# Patient Record
Sex: Female | Born: 1970 | Race: White | Hispanic: No | Marital: Married | Smoking: Former smoker
Health system: Southern US, Community
[De-identification: ages and names within clinical notes are randomized; demographics above are authoritative.]

## PROBLEM LIST (undated history)

## (undated) DIAGNOSIS — S0300XA Dislocation of jaw, unspecified side, initial encounter: Secondary | ICD-10-CM

## (undated) DIAGNOSIS — E039 Hypothyroidism, unspecified: Secondary | ICD-10-CM

## (undated) DIAGNOSIS — R011 Cardiac murmur, unspecified: Secondary | ICD-10-CM

## (undated) DIAGNOSIS — T4145XA Adverse effect of unspecified anesthetic, initial encounter: Secondary | ICD-10-CM

## (undated) DIAGNOSIS — Z87442 Personal history of urinary calculi: Secondary | ICD-10-CM

## (undated) DIAGNOSIS — F329 Major depressive disorder, single episode, unspecified: Secondary | ICD-10-CM

## (undated) DIAGNOSIS — I82812 Embolism and thrombosis of superficial veins of left lower extremities: Secondary | ICD-10-CM

## (undated) DIAGNOSIS — R51 Headache: Secondary | ICD-10-CM

## (undated) DIAGNOSIS — F32A Depression, unspecified: Secondary | ICD-10-CM

## (undated) DIAGNOSIS — D649 Anemia, unspecified: Secondary | ICD-10-CM

## (undated) DIAGNOSIS — J42 Unspecified chronic bronchitis: Secondary | ICD-10-CM

## (undated) DIAGNOSIS — R519 Headache, unspecified: Secondary | ICD-10-CM

## (undated) DIAGNOSIS — K219 Gastro-esophageal reflux disease without esophagitis: Secondary | ICD-10-CM

## (undated) DIAGNOSIS — T8859XA Other complications of anesthesia, initial encounter: Secondary | ICD-10-CM

## (undated) DIAGNOSIS — E0590 Thyrotoxicosis, unspecified without thyrotoxic crisis or storm: Secondary | ICD-10-CM

## (undated) HISTORY — PX: ABDOMINAL HYSTERECTOMY: SHX81

## (undated) HISTORY — PX: TUBAL LIGATION: SHX77

## (undated) HISTORY — PX: ENDOMETRIAL ABLATION: SHX621

---

## 2017-03-18 ENCOUNTER — Other Ambulatory Visit: Payer: Self-pay | Admitting: Orthopedic Surgery

## 2017-03-29 NOTE — Pre-Procedure Instructions (Addendum)
Lori PicaMelissa Reyes  03/29/2017      [facility] [facility] [place], [place] - [address] [place] [id number] Phone: [phone] Fax: [phone]    Your procedure is scheduled on Thursday, August 9th   [facility] Admitting at              (arrival time is per your surgeon's request- 9:15am)                                                                                                                                                         posted surgery time is 12:15p-2:15p)   Call this number if you have problems the morning of surgery:  [phone], for other questions call [phone] Mon--Fri from 8a-4p   Remember:   Do not eat food or drink liquids after midnight Wednesday.              4-5 days prior to surgery, STOP TAKING any Vitamins, Herbal Supplements, Anti-inflammatories.   Take these medicines the morning of surgery with A SIP OF WATER : Zyrtec, Klonopin, Levothyroxine, Omeprazole, Tramadol   Do not wear jewelry, make-up or nail polish.  Do not wear lotions, powders, perfumes, or deoderant.  Do not shave 48 hours prior to surgery.     Do not bring valuables to the hospital.  [facility] is not responsible for any belongings or valuables.  Contacts, dentures or bridgework may not be worn into surgery.  Leave your suitcase in the car.  After surgery it may be brought to your room. For patients admitted to the hospital, discharge time will be determined by your treatment team.  Please read over the following fact sheets that you were given. Pain Booklet, MRSA Information and Surgical Site Infection Prevention      [place]- Preparing For Surgery  Before surgery, you can play an important role. Because skin is not sterile, your skin needs to be as free of germs as possible. You can reduce the number of germs on your skin by washing with CHG (chlorahexidine gluconate) Soap before surgery.   CHG is an antiseptic cleaner which kills germs and bonds with the skin to continue killing germs even after washing.  Please do not use if you have an allergy to CHG or antibacterial soaps. If your skin becomes reddened/irritated stop using the CHG.  Do not shave (including legs and underarms) for at least 48 hours prior to first CHG shower. It is OK to shave your face.  Please follow these instructions carefully.   1. Shower the NIGHT BEFORE SURGERY and the MORNING OF SURGERY with CHG.   2. If you chose to wash your hair, wash your hair first as usual with your normal shampoo.  3. After you shampoo, rinse your hair and body thoroughly to remove the shampoo.  4. Use CHG as you would any other liquid soap. You can apply CHG directly to the skin and wash gently with a scrungie or a clean washcloth.   5. Apply the CHG Soap to your body ONLY FROM THE NECK DOWN.  Do not use on open wounds or open sores. Avoid contact with your eyes, ears, mouth and genitals (private parts). Wash genitals (private parts) with your normal soap.  6. Wash thoroughly, paying special attention to the area where your surgery will be performed.  7. Thoroughly rinse your body with warm water from the neck down.  8. DO NOT shower/wash with your normal soap after using and rinsing off the CHG Soap.  9. Pat yourself dry with a CLEAN TOWEL.   10. Wear CLEAN PAJAMAS   11. Place CLEAN SHEETS on your bed the night of your first shower and DO NOT SLEEP WITH PETS.    Day of Surgery: Do not apply any deodorants/lotions. Please wear clean clothes to the hospital/surgery center.

## 2017-04-01 ENCOUNTER — Encounter (HOSPITAL_COMMUNITY): Payer: Self-pay

## 2017-04-01 ENCOUNTER — Encounter (HOSPITAL_COMMUNITY)
Admission: RE | Admit: 2017-04-01 | Discharge: 2017-04-01 | Disposition: A | Discharge status: Home / Self Care | Payer: BLUE CROSS/BLUE SHIELD | Source: Ambulatory Visit | Attending: Orthopedic Surgery | Admitting: Orthopedic Surgery

## 2017-04-01 DIAGNOSIS — Z01818 Encounter for other preprocedural examination: Secondary | ICD-10-CM | POA: Diagnosis not present

## 2017-04-01 DIAGNOSIS — Z87891 Personal history of nicotine dependence: Secondary | ICD-10-CM | POA: Insufficient documentation

## 2017-04-01 DIAGNOSIS — Z79899 Other long term (current) drug therapy: Secondary | ICD-10-CM | POA: Diagnosis not present

## 2017-04-01 DIAGNOSIS — M79602 Pain in left arm: Secondary | ICD-10-CM | POA: Insufficient documentation

## 2017-04-01 DIAGNOSIS — Z87442 Personal history of urinary calculi: Secondary | ICD-10-CM | POA: Insufficient documentation

## 2017-04-01 DIAGNOSIS — Z86718 Personal history of other venous thrombosis and embolism: Secondary | ICD-10-CM | POA: Diagnosis not present

## 2017-04-01 DIAGNOSIS — R011 Cardiac murmur, unspecified: Secondary | ICD-10-CM | POA: Diagnosis not present

## 2017-04-01 DIAGNOSIS — K219 Gastro-esophageal reflux disease without esophagitis: Secondary | ICD-10-CM | POA: Insufficient documentation

## 2017-04-01 DIAGNOSIS — Z01812 Encounter for preprocedural laboratory examination: Secondary | ICD-10-CM | POA: Insufficient documentation

## 2017-04-01 DIAGNOSIS — E890 Postprocedural hypothyroidism: Secondary | ICD-10-CM | POA: Insufficient documentation

## 2017-04-01 HISTORY — DX: Dislocation of jaw, unspecified side, initial encounter: S03.00XA

## 2017-04-01 HISTORY — DX: Embolism and thrombosis of superficial veins of left lower extremity: I82.812

## 2017-04-01 HISTORY — DX: Personal history of urinary calculi: Z87.442

## 2017-04-01 HISTORY — DX: Other complications of anesthesia, initial encounter: T88.59XA

## 2017-04-01 HISTORY — DX: Gastro-esophageal reflux disease without esophagitis: K21.9

## 2017-04-01 HISTORY — DX: Hypothyroidism, unspecified: E03.9

## 2017-04-01 HISTORY — DX: Anemia, unspecified: D64.9

## 2017-04-01 HISTORY — DX: Depression, unspecified: F32.A

## 2017-04-01 HISTORY — DX: Major depressive disorder, single episode, unspecified: F32.9

## 2017-04-01 HISTORY — DX: Thyrotoxicosis, unspecified without thyrotoxic crisis or storm: E05.90

## 2017-04-01 HISTORY — DX: Adverse effect of unspecified anesthetic, initial encounter: T41.45XA

## 2017-04-01 HISTORY — DX: Cardiac murmur, unspecified: R01.1

## 2017-04-01 HISTORY — DX: Headache: R51

## 2017-04-01 HISTORY — DX: Unspecified chronic bronchitis: J42

## 2017-04-01 HISTORY — DX: Headache, unspecified: R51.9

## 2017-04-01 LAB — COMPREHENSIVE METABOLIC PANEL
ALBUMIN: 4.1 g/dL (ref 3.5–5.0)
ALT: 25 U/L (ref 14–54)
ANION GAP: 6 (ref 5–15)
AST: 25 U/L (ref 15–41)
Alkaline Phosphatase: 90 U/L (ref 38–126)
BUN: 9 mg/dL (ref 6–20)
CHLORIDE: 104 mmol/L (ref 101–111)
CO2: 29 mmol/L (ref 22–32)
Calcium: 9.6 mg/dL (ref 8.9–10.3)
Creatinine, Ser: 0.86 mg/dL (ref 0.44–1.00)
GFR calc Af Amer: >60 mL/min (ref >60)
GFR calc non Af Amer: >60 mL/min (ref >60)
GLUCOSE: 108 mg/dL — AB (ref 65–99)
POTASSIUM: 3.8 mmol/L (ref 3.5–5.1)
SODIUM: 139 mmol/L (ref 135–145)
Total Bilirubin: 0.5 mg/dL (ref 0.3–1.2)
Total Protein: 7 g/dL (ref 6.5–8.1)

## 2017-04-01 LAB — SURGICAL PCR SCREEN
MRSA, PCR: NEGATIVE
Staphylococcus aureus: NEGATIVE

## 2017-04-01 LAB — URINALYSIS, ROUTINE W REFLEX MICROSCOPIC
BILIRUBIN URINE: NEGATIVE
Bacteria, UA: NONE SEEN
Glucose, UA: NEGATIVE mg/dL
Ketones, ur: NEGATIVE mg/dL
NITRITE: NEGATIVE
PROTEIN: NEGATIVE mg/dL
SPECIFIC GRAVITY, URINE: 1.009 (ref 1.005–1.030)
pH: 5 (ref 5.0–8.0)

## 2017-04-01 LAB — CBC WITH DIFFERENTIAL/PLATELET
BASOS ABS: 0 10*3/uL (ref 0.0–0.1)
Basophils Relative: 0 %
EOS PCT: 2 %
Eosinophils Absolute: 0.3 10*3/uL (ref 0.0–0.7)
HEMATOCRIT: 39 % (ref 36.0–46.0)
Hemoglobin: 13 g/dL (ref 12.0–15.0)
LYMPHS ABS: 3.4 10*3/uL (ref 0.7–4.0)
Lymphocytes Relative: 29 %
MCH: 30.3 pg (ref 26.0–34.0)
MCHC: 33.3 g/dL (ref 30.0–36.0)
MCV: 90.9 fL (ref 78.0–100.0)
MONO ABS: 0.6 10*3/uL (ref 0.1–1.0)
MONOS PCT: 5 %
NEUTROS ABS: 7.6 10*3/uL (ref 1.7–7.7)
Neutrophils Relative %: 64 %
Platelets: 297 10*3/uL (ref 150–400)
RBC: 4.29 MIL/uL (ref 3.87–5.11)
RDW: 13.2 % (ref 11.5–15.5)
WBC: 11.9 10*3/uL — ABNORMAL HIGH (ref 4.0–10.5)

## 2017-04-01 LAB — APTT: aPTT: 34 seconds (ref 24–36)

## 2017-04-01 LAB — PROTIME-INR
INR: 0.95
Prothrombin Time: 12.7 seconds (ref 11.4–15.2)

## 2017-04-01 NOTE — Progress Notes (Addendum)
Anesthesia PAT Evaluation: Patient is a 46 year old female scheduled for C5-6 anterior disc arthroplasty on 04/04/17 by Dr. Phylliss Bob. 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 Prior to 2013, she was also known as Lori Reyes.   History includes former smoker (quit ~ 2017), murmur (only trace MR/TR by 2018 echo), chronic bronchitis, Graves' disease '03 s/p radioactive iodine, hypothyroidism, TMJ, GERD, anemia (since childhood), tubal ligation, hysterectomy, nephrolithiasis, LLE superficial venous thrombosis '10 (denied need for anticoagulants and no personal or family history of clotting disorder, although reported her grandmother was treated for "blood clots" with Plavix).   Anesthesia History: She reported that she was told by the surgeon Dr. Susann Givens and anesthesiologist that they "nearly lost her" after receiving succinylcholine for tubal ligation in [place], [place] at a free standing surgical center [facility] [phone]) around 2009. She thinks she required a reversal agent, but does not recall having to be reintubated. They wanted to keep her overnight, but she refused. She was told to get a medical alert bracelet, although she has not gotten one (discussed importance of having medical alert identification). She does not recall being told that she had suspected pseudocholinesterase deficiency and was not referred for testing. There is no family history of anesthesia complications. She has had subsequent procedures without succinylcholine including hysterectomy at [facility] in [place], [place] (aka [facility]; Dr. Valeta Harms) in 2016 and uterine ablation without incident. 2010 and 2016 anesthesia records requested and received. - 12/17/08 ([place], laparoscopic bilateral partial salpingectomy, Dr. Susann Givens. Anesthesiologist Dr. Alfonse Ras. CRNA Doneen Poisson). Prolonged neuromuscular blockade from  succinylcholine, lasted approximately one hour. MAC 3 used to place 7.0 oral ETT. - 2016 (looks like 03/03/15) anesthesia record ([facility]) intraoperative meds given include a fentanyl, lidocaine, propofol, rocuronium, dexamethasone, glycopyrrolate, ephedrine, meperidine, ketorolac, ondansetron, neostigmine, midazolam, hydromorphone. Direct laryngoscopy [place] 2 used to place 7.0 ETT.   Meds include Zyrtec, clonazepam, Lexapro, Flonase, levothyroxine, Cytomel, Robaxin, Prilosec, Ultram.  PCP is Dr. Raquel James with [facility] [phone]). Patient lives in [place], [place].   BP 127/64   Pulse 72   Temp (!) 36.2 C   Resp 20   Ht 5' 3.75" (1.619 m)   Wt 166 lb 14.4 oz (75.7 kg)   SpO2 98%   BMI 28.87 kg/m  Exam shows a pleasant Caucasian female in NAD. Heart RRR. I did not appreciate a murmur. Lungs clear. She denied ankle edema. No carotid bruits noted. Mallampati II.   She reported EKG and echocardiogram were done earlier this year for evaluation of swelling which has since resolved. She reported tests as "normal". She said she had a normal exercise treadmill test many years ago ( > 3 years) that was also normal. She denied chest pain, SOB, edema. As a child she did have a few syncopal episodes (ie, when her grandmother had blood drawn and once when her hair was being rolled), but denied any syncope since around age 55.   EKG and echocardiogram requested. (She believes either done at [facility] or [facility] [place] prior to 2013.) Update: - Echo 11/13/16 ([facility]): Conclusions: 1. Left ventricle: The left ventricular cavity size is normal. There is mild concentric hypertrophy noted. Systolic function is normal. Ejection fraction is 60%. Wall motion is normal. There are no regional wall motion abnormalities. 2. Unable to calculate PA pressure. 3. Trace mitral regurgitation. Trace tricuspid regurgitation. 4.  Normal  study.  Preoperative labs noted. Glucose 108. Cr 0.86. H/H 13.0/39.0. Trace leukocytes, negative nitrites on UA.  2009 and 2016 anesthesia records summarized above. Paper copies are in her chart for review by her anesthesia team as needed. Question if she may have pseudocholinesterase deficiency, although she has never been tested and is not aware of any family members that have any known anesthesia complications.   I will follow-up on EKG tracing from [place] regarding 2018 EKG. It would have been done at [facility] or possibly Dr. Evette Georges office. If one not received then we would have to get one on the day of surgery. (Update 04/02/17 3:10 PM: 10/31/16 EKG received from [facility] that showed NSR. If no acute changes then I anticipate that she can proceed as planned. Anesthesiologist to determine definitive anesthesia plan on the day of surgery.)  Lori Reyes [facility] Short Stay [facility]/Anesthesiology Phone [phone] 04/01/2017 5:29 PM

## 2017-04-01 NOTE — Progress Notes (Signed)
Patient states that back in 2009 during her BTL, she was told that "they almost lost her, and that I had reaction to succinylcholine".  They told her to have a bracelet made.  She doesn't remember all that happened. Surgery was done by a Dr. Diamantina Providenceacus, her ob/gyn.  [facility] was [facility], [place], [place].  Her other name at this time was Lori Reyes  She was also told she had a murmur.  Denies any fatigue, chest pain, sob.  Hasn't seen a cardio or has had any cardiac tests. PCP is Dr. Chanda Busingachel Kelly  LOV 03/2017 I did call [facility] [place] to get copy of her EKG

## 2017-04-02 NOTE — Anesthesia Preprocedure Evaluation (Addendum)
Anesthesia Evaluation  Patient identified by MRN, date of birth, ID band Patient awake    Reviewed: Allergy & Precautions, NPO status , Patient's Chart, lab work & pertinent test results  History of Anesthesia Complications (+) PSEUDOCHOLINESTERASE DEFICIENCY  Airway Mallampati: II  TM Distance: >3 FB Neck ROM: Full    Dental  (+) Dental Advisory Given   Pulmonary former smoker,    breath sounds clear to auscultation       Cardiovascular negative cardio ROS   Rhythm:Regular Rate:Normal     Neuro/Psych negative neurological ROS     GI/Hepatic Neg liver ROS, GERD  ,  Endo/Other  Hypothyroidism   Renal/GU negative Renal ROS     Musculoskeletal negative musculoskeletal ROS (+)   Abdominal   Peds  Hematology negative hematology ROS (+)   Anesthesia Other Findings   Reproductive/Obstetrics                            Lab Results  Component Value Date   WBC 11.9 (H) 04/01/2017   HGB 13.0 04/01/2017   HCT 39.0 04/01/2017   MCV 90.9 04/01/2017   PLT 297 04/01/2017   Lab Results  Component Value Date   CREATININE 0.86 04/01/2017   BUN 9 04/01/2017   NA 139 04/01/2017   K 3.8 04/01/2017   CL 104 04/01/2017   CO2 29 04/01/2017    Anesthesia Physical Anesthesia Plan  ASA: II  Anesthesia Plan: General   Post-op Pain Management:    Induction: Intravenous  PONV Risk Score and Plan: 3 and Ondansetron, Dexamethasone, Midazolam and Treatment may vary due to age or medical condition  Airway Management Planned: Oral ETT  Additional Equipment:   Intra-op Plan:   Post-operative Plan: Extubation in OR  Informed Consent: I have reviewed the patients History and Physical, chart, labs and discussed the procedure including the risks, benefits and alternatives for the proposed anesthesia with the patient or authorized representative who has indicated his/her understanding and acceptance.    Dental advisory given  Plan Discussed with: CRNA  Anesthesia Plan Comments:        Anesthesia Quick Evaluation

## 2017-04-04 ENCOUNTER — Encounter (HOSPITAL_COMMUNITY): Payer: Self-pay | Admitting: *Deleted

## 2017-04-04 ENCOUNTER — Encounter (HOSPITAL_COMMUNITY)
Admission: RE | Disposition: A | Discharge status: Home / Self Care | Payer: Self-pay | Source: Ambulatory Visit | Attending: Orthopedic Surgery

## 2017-04-04 ENCOUNTER — Observation Stay (HOSPITAL_COMMUNITY)
Admission: RE | Admit: 2017-04-04 | Discharge: 2017-04-05 | Disposition: A | Discharge status: Home / Self Care | Payer: BLUE CROSS/BLUE SHIELD | Source: Ambulatory Visit | Attending: Orthopedic Surgery | Admitting: Orthopedic Surgery

## 2017-04-04 ENCOUNTER — Ambulatory Visit (HOSPITAL_COMMUNITY): Payer: BLUE CROSS/BLUE SHIELD

## 2017-04-04 ENCOUNTER — Ambulatory Visit (HOSPITAL_COMMUNITY): Payer: BLUE CROSS/BLUE SHIELD | Admitting: Vascular Surgery

## 2017-04-04 ENCOUNTER — Ambulatory Visit (HOSPITAL_COMMUNITY): Payer: BLUE CROSS/BLUE SHIELD | Admitting: Certified Registered"

## 2017-04-04 DIAGNOSIS — Z86718 Personal history of other venous thrombosis and embolism: Secondary | ICD-10-CM | POA: Diagnosis not present

## 2017-04-04 DIAGNOSIS — R011 Cardiac murmur, unspecified: Secondary | ICD-10-CM | POA: Insufficient documentation

## 2017-04-04 DIAGNOSIS — Z91048 Other nonmedicinal substance allergy status: Secondary | ICD-10-CM | POA: Insufficient documentation

## 2017-04-04 DIAGNOSIS — E039 Hypothyroidism, unspecified: Secondary | ICD-10-CM | POA: Diagnosis not present

## 2017-04-04 DIAGNOSIS — K219 Gastro-esophageal reflux disease without esophagitis: Secondary | ICD-10-CM | POA: Insufficient documentation

## 2017-04-04 DIAGNOSIS — Z90710 Acquired absence of both cervix and uterus: Secondary | ICD-10-CM | POA: Insufficient documentation

## 2017-04-04 DIAGNOSIS — E0500 Thyrotoxicosis with diffuse goiter without thyrotoxic crisis or storm: Secondary | ICD-10-CM | POA: Insufficient documentation

## 2017-04-04 DIAGNOSIS — M79602 Pain in left arm: Secondary | ICD-10-CM | POA: Insufficient documentation

## 2017-04-04 DIAGNOSIS — Z87891 Personal history of nicotine dependence: Secondary | ICD-10-CM | POA: Insufficient documentation

## 2017-04-04 DIAGNOSIS — M50122 Cervical disc disorder at C5-C6 level with radiculopathy: Secondary | ICD-10-CM | POA: Diagnosis not present

## 2017-04-04 DIAGNOSIS — M26609 Unspecified temporomandibular joint disorder, unspecified side: Secondary | ICD-10-CM | POA: Insufficient documentation

## 2017-04-04 DIAGNOSIS — F329 Major depressive disorder, single episode, unspecified: Secondary | ICD-10-CM | POA: Insufficient documentation

## 2017-04-04 DIAGNOSIS — Z79899 Other long term (current) drug therapy: Secondary | ICD-10-CM | POA: Insufficient documentation

## 2017-04-04 DIAGNOSIS — D649 Anemia, unspecified: Secondary | ICD-10-CM | POA: Diagnosis not present

## 2017-04-04 DIAGNOSIS — Z419 Encounter for procedure for purposes other than remedying health state, unspecified: Secondary | ICD-10-CM

## 2017-04-04 DIAGNOSIS — Z888 Allergy status to other drugs, medicaments and biological substances status: Secondary | ICD-10-CM | POA: Diagnosis not present

## 2017-04-04 DIAGNOSIS — M48061 Spinal stenosis, lumbar region without neurogenic claudication: Secondary | ICD-10-CM | POA: Insufficient documentation

## 2017-04-04 DIAGNOSIS — M4802 Spinal stenosis, cervical region: Secondary | ICD-10-CM | POA: Diagnosis present

## 2017-04-04 DIAGNOSIS — Z87442 Personal history of urinary calculi: Secondary | ICD-10-CM | POA: Diagnosis not present

## 2017-04-04 DIAGNOSIS — M5410 Radiculopathy, site unspecified: Secondary | ICD-10-CM | POA: Diagnosis present

## 2017-04-04 DIAGNOSIS — M5412 Radiculopathy, cervical region: Secondary | ICD-10-CM

## 2017-04-04 HISTORY — PX: CERVICAL DISC ARTHROPLASTY: SHX587

## 2017-04-04 SURGERY — CERVICAL ANTERIOR DISC ARTHROPLASTY
Anesthesia: General | Site: Neck | Laterality: Left

## 2017-04-04 MED ORDER — ACETAMINOPHEN 325 MG PO TABS: 650.0000 mg | ORAL_TABLET | ORAL | Status: DC | PRN

## 2017-04-04 MED ORDER — MENTHOL 3 MG MT LOZG: 1.0000 | LOZENGE | OROMUCOSAL | Status: DC | PRN

## 2017-04-04 MED ORDER — HYDROMORPHONE HCL 1 MG/ML IJ SOLN
0.2500 mg | INTRAMUSCULAR | Status: DC | PRN
  Administered 2017-04-04 (×2): 0.5 mg via INTRAVENOUS

## 2017-04-04 MED ORDER — OXYCODONE-ACETAMINOPHEN 5-325 MG PO TABS
ORAL_TABLET | ORAL | Status: AC
  Filled 2017-04-04: qty 2

## 2017-04-04 MED ORDER — ACETAMINOPHEN 650 MG RE SUPP: 650.0000 mg | RECTAL | Status: DC | PRN

## 2017-04-04 MED ORDER — PANTOPRAZOLE SODIUM 40 MG PO TBEC
40.0000 mg | DELAYED_RELEASE_TABLET | Freq: Every day | ORAL | Status: DC
  Administered 2017-04-05: 40 mg via ORAL
  Filled 2017-04-04: qty 1

## 2017-04-04 MED ORDER — PROMETHAZINE HCL 25 MG/ML IJ SOLN: 6.2500 mg | INTRAMUSCULAR | Status: DC | PRN

## 2017-04-04 MED ORDER — DIAZEPAM 5 MG PO TABS
ORAL_TABLET | ORAL | Status: AC
  Filled 2017-04-04: qty 1

## 2017-04-04 MED ORDER — 0.9 % SODIUM CHLORIDE (POUR BTL) OPTIME
TOPICAL | Status: DC | PRN
  Administered 2017-04-04: 1000 mL

## 2017-04-04 MED ORDER — POVIDONE-IODINE 7.5 % EX SOLN: Freq: Once | CUTANEOUS | Status: DC

## 2017-04-04 MED ORDER — LIDOCAINE 2% (20 MG/ML) 5 ML SYRINGE
INTRAMUSCULAR | Status: AC
  Filled 2017-04-04: qty 5

## 2017-04-04 MED ORDER — ONDANSETRON HCL 4 MG/2ML IJ SOLN
INTRAMUSCULAR | Status: AC
  Filled 2017-04-04: qty 2

## 2017-04-04 MED ORDER — PHENYLEPHRINE 40 MCG/ML (10ML) SYRINGE FOR IV PUSH (FOR BLOOD PRESSURE SUPPORT)
PREFILLED_SYRINGE | INTRAVENOUS | Status: AC
  Filled 2017-04-04: qty 10

## 2017-04-04 MED ORDER — PROPOFOL 10 MG/ML IV BOLUS
INTRAVENOUS | Status: AC
  Filled 2017-04-04: qty 20

## 2017-04-04 MED ORDER — ROCURONIUM BROMIDE 100 MG/10ML IV SOLN
INTRAVENOUS | Status: DC | PRN
  Administered 2017-04-04: 20 mg via INTRAVENOUS
  Administered 2017-04-04: 50 mg via INTRAVENOUS
  Administered 2017-04-04: 10 mg via INTRAVENOUS

## 2017-04-04 MED ORDER — SUGAMMADEX SODIUM 200 MG/2ML IV SOLN
INTRAVENOUS | Status: DC | PRN
  Administered 2017-04-04: 150.6 mg via INTRAVENOUS

## 2017-04-04 MED ORDER — DOCUSATE SODIUM 100 MG PO CAPS
100.0000 mg | ORAL_CAPSULE | Freq: Two times a day (BID) | ORAL | Status: DC
  Administered 2017-04-04 – 2017-04-05 (×2): 100 mg via ORAL
  Filled 2017-04-04 (×2): qty 1

## 2017-04-04 MED ORDER — CLONAZEPAM 0.5 MG PO TABS
2.0000 mg | ORAL_TABLET | Freq: Two times a day (BID) | ORAL | Status: DC
  Administered 2017-04-04 – 2017-04-05 (×2): 2 mg via ORAL
  Filled 2017-04-04 (×2): qty 4

## 2017-04-04 MED ORDER — ONDANSETRON HCL 4 MG PO TABS
4.0000 mg | ORAL_TABLET | Freq: Four times a day (QID) | ORAL | Status: DC | PRN
  Administered 2017-04-04: 4 mg via ORAL
  Filled 2017-04-04: qty 1

## 2017-04-04 MED ORDER — DIAZEPAM 5 MG PO TABS
5.0000 mg | ORAL_TABLET | Freq: Four times a day (QID) | ORAL | Status: DC | PRN
  Administered 2017-04-04 – 2017-04-05 (×2): 5 mg via ORAL
  Filled 2017-04-04: qty 1

## 2017-04-04 MED ORDER — SCOPOLAMINE 1 MG/3DAYS TD PT72
MEDICATED_PATCH | TRANSDERMAL | Status: AC
  Filled 2017-04-04: qty 1

## 2017-04-04 MED ORDER — ADULT MULTIVITAMIN W/MINERALS CH
1.0000 | ORAL_TABLET | Freq: Every day | ORAL | Status: DC
  Administered 2017-04-04 – 2017-04-05 (×2): 1 via ORAL
  Filled 2017-04-04 (×2): qty 1

## 2017-04-04 MED ORDER — CEFAZOLIN SODIUM-DEXTROSE 2-4 GM/100ML-% IV SOLN
2.0000 g | Freq: Three times a day (TID) | INTRAVENOUS | Status: AC
  Administered 2017-04-04 – 2017-04-05 (×2): 2 g via INTRAVENOUS
  Filled 2017-04-04 (×2): qty 100

## 2017-04-04 MED ORDER — ALUM & MAG HYDROXIDE-SIMETH 200-200-20 MG/5ML PO SUSP: 30.0000 mL | Freq: Four times a day (QID) | ORAL | Status: DC | PRN

## 2017-04-04 MED ORDER — CEFAZOLIN SODIUM-DEXTROSE 2-4 GM/100ML-% IV SOLN
2.0000 g | INTRAVENOUS | Status: AC
  Administered 2017-04-04: 2 g via INTRAVENOUS
  Filled 2017-04-04: qty 100

## 2017-04-04 MED ORDER — THROMBIN 20000 UNITS EX SOLR
CUTANEOUS | Status: DC | PRN
  Administered 2017-04-04: 12:00:00 via TOPICAL

## 2017-04-04 MED ORDER — FENTANYL CITRATE (PF) 250 MCG/5ML IJ SOLN
INTRAMUSCULAR | Status: DC | PRN
  Administered 2017-04-04: 100 ug via INTRAVENOUS

## 2017-04-04 MED ORDER — ZOLPIDEM TARTRATE 5 MG PO TABS: 5.0000 mg | ORAL_TABLET | Freq: Every evening | ORAL | Status: DC | PRN

## 2017-04-04 MED ORDER — GLYCOPYRROLATE 0.2 MG/ML IJ SOLN
INTRAMUSCULAR | Status: DC | PRN
  Administered 2017-04-04: 0.2 mg via INTRAVENOUS

## 2017-04-04 MED ORDER — SODIUM CHLORIDE 0.9% FLUSH: 3.0000 mL | INTRAVENOUS | Status: DC | PRN

## 2017-04-04 MED ORDER — MORPHINE SULFATE (PF) 4 MG/ML IV SOLN
1.0000 mg | INTRAVENOUS | Status: DC | PRN
  Administered 2017-04-04: 2 mg via INTRAVENOUS
  Filled 2017-04-04: qty 1

## 2017-04-04 MED ORDER — BUPIVACAINE-EPINEPHRINE 0.25% -1:200000 IJ SOLN
INTRAMUSCULAR | Status: DC | PRN
  Administered 2017-04-04: 30 mL

## 2017-04-04 MED ORDER — HYDROMORPHONE HCL 1 MG/ML IJ SOLN
INTRAMUSCULAR | Status: AC
  Administered 2017-04-04: 0.5 mg via INTRAVENOUS
  Filled 2017-04-04: qty 1

## 2017-04-04 MED ORDER — FLEET ENEMA 7-19 GM/118ML RE ENEM: 1.0000 | ENEMA | Freq: Once | RECTAL | Status: DC | PRN

## 2017-04-04 MED ORDER — LEVOTHYROXINE SODIUM 88 MCG PO TABS
88.0000 ug | ORAL_TABLET | Freq: Every day | ORAL | Status: DC
  Administered 2017-04-05: 88 ug via ORAL
  Filled 2017-04-04: qty 1

## 2017-04-04 MED ORDER — SENNOSIDES-DOCUSATE SODIUM 8.6-50 MG PO TABS
1.0000 | ORAL_TABLET | Freq: Every evening | ORAL | Status: DC | PRN
Start: 2017-04-04 — End: 2017-04-05

## 2017-04-04 MED ORDER — PANTOPRAZOLE SODIUM 40 MG IV SOLR: 40.0000 mg | Freq: Every day | INTRAVENOUS | Status: DC

## 2017-04-04 MED ORDER — SODIUM CHLORIDE 0.9% FLUSH
3.0000 mL | Freq: Two times a day (BID) | INTRAVENOUS | Status: DC
  Administered 2017-04-04: 3 mL via INTRAVENOUS

## 2017-04-04 MED ORDER — SODIUM CHLORIDE 0.9 % IV SOLN: 250.0000 mL | INTRAVENOUS | Status: DC

## 2017-04-04 MED ORDER — FENTANYL CITRATE (PF) 250 MCG/5ML IJ SOLN
INTRAMUSCULAR | Status: AC
  Filled 2017-04-04: qty 5

## 2017-04-04 MED ORDER — FLUTICASONE PROPIONATE 50 MCG/ACT NA SUSP
1.0000 | Freq: Every day | NASAL | Status: DC
  Administered 2017-04-04: 1 via NASAL
  Filled 2017-04-04: qty 16

## 2017-04-04 MED ORDER — LIOTHYRONINE SODIUM 5 MCG PO TABS
5.0000 ug | ORAL_TABLET | Freq: Two times a day (BID) | ORAL | Status: DC
  Administered 2017-04-04 – 2017-04-05 (×2): 5 ug via ORAL
  Filled 2017-04-04 (×2): qty 1

## 2017-04-04 MED ORDER — ONDANSETRON HCL 4 MG/2ML IJ SOLN: 4.0000 mg | Freq: Four times a day (QID) | INTRAMUSCULAR | Status: DC | PRN

## 2017-04-04 MED ORDER — ROCURONIUM BROMIDE 10 MG/ML (PF) SYRINGE
PREFILLED_SYRINGE | INTRAVENOUS | Status: AC
  Filled 2017-04-04: qty 5

## 2017-04-04 MED ORDER — PHENOL 1.4 % MT LIQD
1.0000 | OROMUCOSAL | Status: DC | PRN
  Administered 2017-04-05: 1 via OROMUCOSAL
  Filled 2017-04-04: qty 177

## 2017-04-04 MED ORDER — THROMBIN 20000 UNITS EX SOLR
CUTANEOUS | Status: AC
  Filled 2017-04-04: qty 20000

## 2017-04-04 MED ORDER — SUGAMMADEX SODIUM 200 MG/2ML IV SOLN
INTRAVENOUS | Status: AC
  Filled 2017-04-04: qty 2

## 2017-04-04 MED ORDER — MIDAZOLAM HCL 2 MG/2ML IJ SOLN
INTRAMUSCULAR | Status: AC
  Filled 2017-04-04: qty 2

## 2017-04-04 MED ORDER — BISACODYL 5 MG PO TBEC: 5.0000 mg | DELAYED_RELEASE_TABLET | Freq: Every day | ORAL | Status: DC | PRN

## 2017-04-04 MED ORDER — LORATADINE 10 MG PO TABS
10.0000 mg | ORAL_TABLET | Freq: Every day | ORAL | Status: DC
  Administered 2017-04-05: 10 mg via ORAL
  Filled 2017-04-04: qty 1

## 2017-04-04 MED ORDER — ONDANSETRON HCL 4 MG/2ML IJ SOLN
INTRAMUSCULAR | Status: DC | PRN
  Administered 2017-04-04: 4 mg via INTRAVENOUS

## 2017-04-04 MED ORDER — LACTATED RINGERS IV SOLN
INTRAVENOUS | Status: DC
  Administered 2017-04-04 (×2): via INTRAVENOUS

## 2017-04-04 MED ORDER — MIDAZOLAM HCL 5 MG/5ML IJ SOLN
INTRAMUSCULAR | Status: DC | PRN
  Administered 2017-04-04: 2 mg via INTRAVENOUS

## 2017-04-04 MED ORDER — PHENYLEPHRINE 40 MCG/ML (10ML) SYRINGE FOR IV PUSH (FOR BLOOD PRESSURE SUPPORT)
PREFILLED_SYRINGE | INTRAVENOUS | Status: DC | PRN
  Administered 2017-04-04: 80 ug via INTRAVENOUS

## 2017-04-04 MED ORDER — PROPOFOL 10 MG/ML IV BOLUS
INTRAVENOUS | Status: DC | PRN
  Administered 2017-04-04: 160 mg via INTRAVENOUS

## 2017-04-04 MED ORDER — MORPHINE SULFATE (PF) 4 MG/ML IV SOLN: 1.0000 mg | INTRAVENOUS | Status: DC | PRN

## 2017-04-04 MED ORDER — EPHEDRINE 5 MG/ML INJ
INTRAVENOUS | Status: AC
  Filled 2017-04-04: qty 10

## 2017-04-04 MED ORDER — LIDOCAINE 2% (20 MG/ML) 5 ML SYRINGE
INTRAMUSCULAR | Status: DC | PRN
  Administered 2017-04-04 (×2): 60 mg via INTRAVENOUS

## 2017-04-04 MED ORDER — EPHEDRINE SULFATE-NACL 50-0.9 MG/10ML-% IV SOSY
PREFILLED_SYRINGE | INTRAVENOUS | Status: DC | PRN
  Administered 2017-04-04: 5 mg via INTRAVENOUS
  Administered 2017-04-04: 10 mg via INTRAVENOUS
  Administered 2017-04-04: 5 mg via INTRAVENOUS

## 2017-04-04 MED ORDER — BUPIVACAINE-EPINEPHRINE (PF) 0.25% -1:200000 IJ SOLN
INTRAMUSCULAR | Status: AC
  Filled 2017-04-04: qty 30

## 2017-04-04 MED ORDER — ESCITALOPRAM OXALATE 20 MG PO TABS
20.0000 mg | ORAL_TABLET | Freq: Every evening | ORAL | Status: DC
  Administered 2017-04-04: 20 mg via ORAL
  Filled 2017-04-04 (×2): qty 1

## 2017-04-04 MED ORDER — OXYCODONE-ACETAMINOPHEN 5-325 MG PO TABS
1.0000 | ORAL_TABLET | ORAL | Status: DC | PRN
  Administered 2017-04-04 – 2017-04-05 (×5): 2 via ORAL
  Filled 2017-04-04 (×4): qty 2

## 2017-04-04 MED ORDER — BUPIVACAINE LIPOSOME 1.3 % IJ SUSP
20.0000 mL | INTRAMUSCULAR | Status: DC
  Filled 2017-04-04: qty 20

## 2017-04-04 SURGICAL SUPPLY — 70 items
BENZOIN TINCTURE PRP APPL 2/3 (GAUZE/BANDAGES/DRESSINGS) ×3 IMPLANT
BIT DRILL NEURO 2X3.1 SFT TUCH (MISCELLANEOUS) ×1 IMPLANT
BIT MILLING PRODISC 2.0 STER (BIT) ×3 IMPLANT
BLADE CLIPPER SURG (BLADE) ×3 IMPLANT
BLADE SURG 15 STRL LF DISP TIS (BLADE) ×1 IMPLANT
BLADE SURG 15 STRL SS (BLADE) ×2
BUR MATCHSTICK NEURO 3.0 LAGG (BURR) IMPLANT
CARTRIDGE OIL MAESTRO DRILL (MISCELLANEOUS) ×1 IMPLANT
CLOSURE STERI-STRIP 1/2X4 (GAUZE/BANDAGES/DRESSINGS) ×1
CLOSURE WOUND 1/2 X4 (GAUZE/BANDAGES/DRESSINGS) ×1
CLSR STERI-STRIP ANTIMIC 1/2X4 (GAUZE/BANDAGES/DRESSINGS) ×2 IMPLANT
CORDS BIPOLAR (ELECTRODE) ×3 IMPLANT
COVER SURGICAL LIGHT HANDLE (MISCELLANEOUS) ×3 IMPLANT
CRADLE DONUT ADULT HEAD (MISCELLANEOUS) ×3 IMPLANT
DIFFUSER DRILL AIR PNEUMATIC (MISCELLANEOUS) ×3 IMPLANT
DISC PRODISC-C MED 5MM (Neuro Prosthesis/Implant) ×3 IMPLANT
DRAIN JACKSON RD 7FR 3/32 (WOUND CARE) IMPLANT
DRAPE C-ARM 42X72 X-RAY (DRAPES) ×3 IMPLANT
DRAPE POUCH INSTRU U-SHP 10X18 (DRAPES) ×3 IMPLANT
DRAPE SURG 17X23 STRL (DRAPES) ×9 IMPLANT
DRILL NEURO 2X3.1 SOFT TOUCH (MISCELLANEOUS) ×3
DURAPREP 26ML APPLICATOR (WOUND CARE) ×3 IMPLANT
ELECT COATED BLADE 2.86 ST (ELECTRODE) ×3 IMPLANT
ELECT REM PT RETURN 9FT ADLT (ELECTROSURGICAL) ×3
ELECTRODE REM PT RTRN 9FT ADLT (ELECTROSURGICAL) ×1 IMPLANT
EVACUATOR SILICONE 100CC (DRAIN) IMPLANT
GAUZE SPONGE 4X4 12PLY STRL (GAUZE/BANDAGES/DRESSINGS) ×3 IMPLANT
GAUZE SPONGE 4X4 16PLY XRAY LF (GAUZE/BANDAGES/DRESSINGS) ×3 IMPLANT
GLOVE BIO SURGEON STRL SZ7 (GLOVE) ×3 IMPLANT
GLOVE BIO SURGEON STRL SZ8 (GLOVE) ×3 IMPLANT
GLOVE BIOGEL PI IND STRL 7.0 (GLOVE) ×2 IMPLANT
GLOVE BIOGEL PI IND STRL 8 (GLOVE) ×1 IMPLANT
GLOVE BIOGEL PI INDICATOR 7.0 (GLOVE) ×4
GLOVE BIOGEL PI INDICATOR 8 (GLOVE) ×2
GOWN STRL REUS W/ TWL LRG LVL3 (GOWN DISPOSABLE) ×1 IMPLANT
GOWN STRL REUS W/ TWL XL LVL3 (GOWN DISPOSABLE) ×1 IMPLANT
GOWN STRL REUS W/TWL LRG LVL3 (GOWN DISPOSABLE) ×2
GOWN STRL REUS W/TWL XL LVL3 (GOWN DISPOSABLE) ×2
IV CATH 14GX2 1/4 (CATHETERS) ×3 IMPLANT
KIT BASIN OR (CUSTOM PROCEDURE TRAY) ×3 IMPLANT
KIT ROOM TURNOVER OR (KITS) ×3 IMPLANT
MANIFOLD NEPTUNE II (INSTRUMENTS) ×3 IMPLANT
NEEDLE PRECISIONGLIDE 27X1.5 (NEEDLE) ×3 IMPLANT
NEEDLE SPNL 20GX3.5 QUINCKE YW (NEEDLE) ×3 IMPLANT
NS IRRIG 1000ML POUR BTL (IV SOLUTION) ×3 IMPLANT
NUT PRODISC C RETAINER (Nut) ×6 IMPLANT
OIL CARTRIDGE MAESTRO DRILL (MISCELLANEOUS) ×3
PACK ORTHO CERVICAL (CUSTOM PROCEDURE TRAY) ×3 IMPLANT
PAD ARMBOARD 7.5X6 YLW CONV (MISCELLANEOUS) ×6 IMPLANT
PATTIES SURGICAL .5 X.5 (GAUZE/BANDAGES/DRESSINGS) IMPLANT
PATTIES SURGICAL .5 X1 (DISPOSABLE) IMPLANT
SCREW PRODISC RETAINER 3.5X14 (PIN) ×6 IMPLANT
SPONGE INTESTINAL PEANUT (DISPOSABLE) ×3 IMPLANT
SPONGE SURGIFOAM ABS GEL 100 (HEMOSTASIS) IMPLANT
STRIP CLOSURE SKIN 1/2X4 (GAUZE/BANDAGES/DRESSINGS) ×2 IMPLANT
SURGIFLO W/THROMBIN 8M KIT (HEMOSTASIS) IMPLANT
SUT MNCRL AB 4-0 PS2 18 (SUTURE) IMPLANT
SUT SILK 4 0 (SUTURE)
SUT SILK 4-0 18XBRD TIE 12 (SUTURE) IMPLANT
SUT VIC AB 2-0 CT2 18 VCP726D (SUTURE) ×3 IMPLANT
SYR BULB IRRIGATION 50ML (SYRINGE) ×3 IMPLANT
SYR CONTROL 10ML LL (SYRINGE) ×6 IMPLANT
TAPE CLOTH 4X10 WHT NS (GAUZE/BANDAGES/DRESSINGS) ×3 IMPLANT
TAPE CLOTH SURG 4X10 WHT LF (GAUZE/BANDAGES/DRESSINGS) ×3 IMPLANT
TAPE UMBILICAL COTTON 1/8X30 (MISCELLANEOUS) ×3 IMPLANT
TIP INSERTER MEDIUM (INSTRUMENTS) ×3 IMPLANT
TOWEL OR 17X24 6PK STRL BLUE (TOWEL DISPOSABLE) ×3 IMPLANT
TOWEL OR 17X26 10 PK STRL BLUE (TOWEL DISPOSABLE) ×3 IMPLANT
WATER STERILE IRR 1000ML POUR (IV SOLUTION) ×3 IMPLANT
YANKAUER SUCT BULB TIP NO VENT (SUCTIONS) ×3 IMPLANT

## 2017-04-04 NOTE — Anesthesia Procedure Notes (Signed)
Procedure Name: Intubation Date/Time: 04/04/2017 10:58 AM Performed by: Freddie Breech Pre-anesthesia Checklist: Patient identified, Emergency Drugs available, Suction available and Patient being monitored Patient Re-evaluated:Patient Re-evaluated prior to induction Oxygen Delivery Method: Circle System Utilized Preoxygenation: Pre-oxygenation with 100% oxygen Induction Type: IV induction Ventilation: Mask ventilation without difficulty Laryngoscope Size: Glidescope and 3 Grade View: Grade I Tube type: Oral Tube size: 7.5 mm Number of attempts: 1 Airway Equipment and Method: Stylet,  Oral airway and LTA kit utilized Placement Confirmation: ETT inserted through vocal cords under direct vision,  positive ETCO2 and breath sounds checked- equal and bilateral Secured at: 21 cm Tube secured with: Tape Dental Injury: Teeth and Oropharynx as per pre-operative assessment

## 2017-04-04 NOTE — Op Note (Signed)
NAME:  Lori, Reyes                   ACCOUNT NO.:  MEDICAL RECORD NO.:  [id number]  PHYSICIAN:  Estill Bamberg, MD           DATE OF BIRTH:  DATE OF PROCEDURE:  04/04/2017                              OPERATIVE REPORT   PREOPERATIVE DIAGNOSES: 1. Left-sided cervical radiculopathy. 2. Left-sided C5-6 disk protrusion.  POSTOPERATIVE DIAGNOSES: 1. Left-sided cervical radiculopathy. 2. Left-sided C5-6 disk protrusion.  PROCEDURE:  C5-6 decompression with placement of DePuy total disk arthroplasty.  SURGEON:  Estill Bamberg, MD.  ASSISTANTJason Coop, PA-C.  ANESTHESIA:  General endotracheal anesthesia.  COMPLICATIONS:  None.  DISPOSITION:  Stable.  ESTIMATED BLOOD LOSS:  Minimal.  INDICATIONS FOR SURGERY:  Briefly, Lori Reyes is a 46 year old female, who did present to me with ongoing pain in her left arm.  Of note, she did sustain a work injury on February 15, 2016.  Since her injury, she did continue to have pain in her neck and left arm, extending into her left thumb.  Her pain was very consistent with left-sided C6 radiculopathy. I did review an MRI, which was notable for a left-sided C5-6 disk protrusion, contacting and likely irritating the left C6 nerve.  Given her ongoing pain and dysfunction and lack of improvement with multiple forms of conservative care, we did discuss the risks and benefits of proceeding with a total disk arthroplasty at the C5-6 level.  The patient was fully aware of the risks and limitations of surgery, including the possibility of lack of resolution of left arm pain.  Given her ongoing pain, she did elect to proceed.  OPERATIVE DETAILS:  On April 04, 2017, the patient was brought to surgery and general endotracheal anesthesia was administered.  The patient was placed supine on the hospital bed.  Her neck was gently extended.  Her arms were secured to her sides.  All bony prominences were meticulously padded.  A time-out procedure was  performed and a left- sided transverse incision was made overlying the C5-6 intervertebral space.  A self-retaining retractor was placed.  I then subperiosteally exposed the vertebral bodies of C5 and C6.  Caspar pins were placed into the C5 and C6 vertebral bodies, well away from the C5-6 intervertebral disk space.  I then distracted across the C5-6 space in a parallel fashion, using a parallel distractor.  The retainer then maintained the distraction.  I then proceeded with a thorough and complete C5-6 intervertebral diskectomy, including a left-sided neuroforaminal decompression.  I was very pleased with the decompression that I was able to accomplish.  I then selected a 5 mm trial, which was inserted to about the posterior margin of the C5 and C6 vertebral bodies.  Liberally using AP and lateral fluoroscopy, I was very pleased with the appearance of the trial.  I then burred a trough per the manufacturer's recommendations, using a rigid guide.  The trial implant was then removed.  The 5 mm total disk arthroplasty implant was then tamped into the appropriate position.  I was very pleased with the final AP and lateral fluoroscopic images.  The wound was then copiously irrigated. The wound was then explored for any undue bleeding, and there was no bleeding encountered.  Bone wax was placed over the holes, where the Caspar pins  were, well after they were removed.  The wound was then closed in layers using 2-0 Vicryl, followed by 4-0 Monocryl.  Benzoin and Steri-Strips were applied, followed by sterile dressing.  All instrument counts were correct at the termination of the procedure.  Of note, Jason CoopKayla McKenzie, PA-C, was my assistant throughout surgery, and did aid in retraction, suctioning, and closure.     Estill BambergMark Asad Keeven, MD     MD/MEDQ  D:  04/04/2017  T:  04/04/2017  Job:  [id number]

## 2017-04-04 NOTE — H&P (Signed)
PREOPERATIVE H&P  Chief Complaint: Left arm pain  HPI: Lori Reyes is a 46 y.o. 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 Acute superficial venous thrombosis of lower extremity, left    LLE "superficial blood clot" s/p clot extraction and vein "sealing" ~ 2010  . Anemia    "born anemic"  . Bronchitis, chronic (HCC)    last flare up this past winter 2017  . Complication of anesthesia    Prolonged NMB from succinylcholine (lasted ~ 1 hour) 12/17/08 at [facility]; (??pseudocholenesterase deficiency)   . Depression   . GERD (gastroesophageal reflux disease)   . Headache    migraine on 08/19/2016  . Heart murmur    "was told that once, been a few yrs" 11/13/16 echo: EF 60%, normal wall motion, trace MR/TR ([facility])  . History of kidney stones    "had one in the past and past it with no problems"  . Hyperthyroidism    has Graves Disease   dx 2003  . Hypothyroidism   . TMJ (dislocation of temporomandibular joint)    on klonopin since 2003        Past Surgical History:  Procedure Laterality Date  . ABDOMINAL HYSTERECTOMY    . ENDOMETRIAL ABLATION     2013  . TUBAL LIGATION     Social History        Social History  . Marital status: Married    Spouse name: N/A  . Number of children: N/A  . Years of education: N/A         Social History Main Topics  . Smoking status: Former Smoker    Packs/day: 0.50    Years: 29.00    Types: Cigarettes  . Smokeless tobacco: Never Used     Comment: she is currently only smoking 1 - 2 cigs every 5-6 days, if that  . Alcohol use No  . Drug use: No  . Sexual activity: Yes    Birth control/  protection: Surgical       Other Topics Concern  . Not on file   Social History Narrative  . No narrative on file   No family history on file.      Allergies  Allergen Reactions  . Bextra [Valdecoxib] Nausea And Vomiting  . Succinylcholine     "nearly killed me"   . Wellbutrin [Bupropion] Hives  . Adhesive [Tape] Rash    Paper tape is ok          Prior to Admission medications   Medication Sig Start Date End Date Taking? Authorizing Provider  cetirizine (ZYRTEC) 10 MG tablet Take 10 mg by mouth daily.   Yes [provider]  clonazePAM (KLONOPIN) 2 MG tablet Take 2 mg by mouth 2 (two) times daily.   Yes [provider]  escitalopram (LEXAPRO) 20 MG tablet Take 20 mg by mouth every evening.   Yes [provider]  fluticasone (FLONASE) 50 MCG/ACT nasal spray Place 1 spray into both nostrils daily.   Yes [provider]  levothyroxine (SYNTHROID, LEVOTHROID) 88 MCG tablet Take 88 mcg by mouth daily before breakfast.   Yes [provider]  liothyronine (CYTOMEL) 5 MCG tablet Take 5  mcg by mouth 2 (two) times daily.   Yes [provider]  methocarbamol (ROBAXIN) 750 MG tablet Take 750 mg by mouth 2 (two) times daily as needed for muscle spasms.   Yes [provider]  Multiple Vitamin (MULTIVITAMIN WITH MINERALS) TABS tablet Take 1 tablet by mouth daily.   Yes [provider]  omeprazole (PRILOSEC) 20 MG capsule Take 20 mg by mouth daily.   Yes [provider]  traMADol (ULTRAM) 50 MG tablet Take 50 mg by mouth every 6 (six) hours as needed for moderate pain.   Yes [provider]     All other systems have been reviewed and were otherwise negative with the exception of those mentioned in the HPI and as above.  Physical Exam: There were no vitals filed for this visit.  General: Alert, no acute distress Cardiovascular: No pedal edema Respiratory: No cyanosis,  no use of accessory musculature Skin: No lesions in the area of chief complaint Neurologic: Sensation intact distally Psychiatric: Patient is competent for consent with normal mood and affect Lymphatic: No axillary or cervical lymphadenopathy  MUSCULOSKELETAL: + spurling sign on the left  Assessment/Plan: Left arm pain  Plan for Procedure(s): CERVICAL 5-6 ANTERIOR DISC ARTHROPLASTY

## 2017-04-04 NOTE — Transfer of Care (Signed)
Immediate Anesthesia Transfer of Care Note  Patient: Lori Reyes  Procedure(s) Performed: Procedure(s) with comments: CERVICAL 5-6 ANTERIOR DISC ARTHROPLASTY (Left) - CERVICAL 5-6 ANTERIOR DISC ARTHROPLASTY  Patient Location: PACU  Anesthesia Type:General  Level of Consciousness: drowsy and patient cooperative  Airway & Oxygen Therapy: Patient Spontanous Breathing and Patient connected to face mask oxygen  Post-op Assessment: Report given to RN and Post -op Vital signs reviewed and unstable, Anesthesiologist notified  Post vital signs: Reviewed and stable  Last Vitals:  Vitals:   04/04/17 0805 04/04/17 1315  BP: 139/77   Pulse: 71   Resp: 18   Temp: 36.6 C 36.7 C  SpO2: 99%     Last Pain:  Vitals:   04/04/17 0815  TempSrc:   PainSc: 8       Patients Stated Pain Goal: 2 (04/04/17 0815)  Complications: No apparent anesthesia complications

## 2017-04-04 NOTE — Anesthesia Postprocedure Evaluation (Signed)
Anesthesia Post Note  Patient: Lori Reyes  Procedure(s) Performed: Procedure(s) (LRB): CERVICAL 5-6 ANTERIOR DISC ARTHROPLASTY (Left)     Patient location during evaluation: PACU Anesthesia Type: General Level of consciousness: awake and alert Pain management: pain level controlled Vital Signs Assessment: post-procedure vital signs reviewed and stable Respiratory status: spontaneous breathing, nonlabored ventilation, respiratory function stable and patient connected to nasal cannula oxygen Cardiovascular status: blood pressure returned to baseline and stable Postop Assessment: no signs of nausea or vomiting Anesthetic complications: no    Last Vitals:  Vitals:   04/04/17 1530 04/04/17 1600  BP: 102/66 114/65  Pulse: 66 65  Resp: 13 16  Temp:    SpO2: 100% 100%    Last Pain:  Vitals:   04/04/17 1500  TempSrc:   PainSc: Asleep                 Kennieth RadFitzgerald, Amarilys Lyles E

## 2017-04-05 ENCOUNTER — Encounter (HOSPITAL_COMMUNITY): Payer: Self-pay | Admitting: Orthopedic Surgery

## 2017-04-05 DIAGNOSIS — M50122 Cervical disc disorder at C5-C6 level with radiculopathy: Secondary | ICD-10-CM | POA: Diagnosis not present

## 2017-04-05 NOTE — Progress Notes (Signed)
    Patient doing well PO Day 1, L arm pain resolved, residual numbness, PO neck pain and stiffness well controlled. She has been up and walking and eating, NL B/B function.    Physical Exam: Vitals:   04/05/17 0012 04/05/17 0405  BP: (!) 115/52 96/65  Pulse: 72 70  Resp: 17 18  Temp: 98 F (36.7 C) 97.8 F (36.6 C)  SpO2: 97% 97%    Dressing in place, soft collar worn appropriately, sitting up in bed comfortably NVI  POD #1 s/p C5-6 Decompression and total disc arthroplasty  - encourage ambulation - Percocet for pain, Valium for muscle spasms - likely d/c home today before lunch

## 2017-04-05 NOTE — Progress Notes (Signed)
Patient is discharged from room 3C07 at this time. Alert and in stable condition. IV site d/c'd and instructions read to patient and husband with understanding verbalized. Left unit via wheelchair with all belongings at side. 

## 2017-04-08 ENCOUNTER — Encounter (HOSPITAL_COMMUNITY): Payer: Self-pay | Admitting: Orthopedic Surgery

## 2017-04-10 ENCOUNTER — Encounter (HOSPITAL_COMMUNITY): Payer: Self-pay | Admitting: Orthopedic Surgery

## 2017-04-10 NOTE — OR Nursing (Signed)
LATE ENTRY: Corrected implant documentation per Kellie ShropshireJamie Sult, RN circulator signed request to purchase documentation.

## 2017-04-10 NOTE — Discharge Summary (Signed)
Patient ID: Lori Reyes Sopher MRN: [id number] DOB/AGE: [birth year] 44 y.o.  Admit date: 04/04/2017 Discharge date: 04/05/2017  Admission Diagnoses:  Active Problems:   Radiculopathy   Discharge Diagnoses:  Same  Past Medical History:  Diagnosis Date  . Acute superficial venous thrombosis of lower extremity, left    LLE "superficial blood clot" s/p clot extraction and vein "sealing" ~ 2010  . Anemia    "born anemic"  . Bronchitis, chronic (HCC)    last flare up this past winter 2017  . 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 Depression   . GERD (gastroesophageal reflux disease)   . Headache    migraine on 08/19/2016  . Heart murmur    "was told that once, been a few yrs" 11/13/16 echo: EF 60%, normal wall motion, trace MR/TR ([facility])  . History of kidney stones    "had one in the past and past it with no problems"  . Hyperthyroidism    has Graves Disease   dx 2003  . Hypothyroidism   . 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 female who was admitted 04/04/2017 for operative treatment of radiculopathy. Patient has severe unremitting pain that affects sleep, daily activities, and work/hobbies. After pre-op clearance the patient was taken to the operating room on 04/04/2017 and underwent  Procedure(s): CERVICAL 5-6 ANTERIOR DISC ARTHROPLASTY.    Patient was given perioperative antibiotics:  Anti-infectives    Start     Dose/Rate Route Frequency Ordered Stop   04/04/17 1900  ceFAZolin (ANCEF) IVPB 2g/100 mL premix     2 g 200 mL/hr over 30 Minutes Intravenous Every 8 hours 04/04/17 1633 04/05/17 0330   04/04/17 0756  ceFAZolin (ANCEF) IVPB  2g/100 mL premix     2 g 200 mL/hr over 30 Minutes Intravenous On call to O.R. 04/04/17 0756 04/04/17 1110       Patient was given sequential compression devices, early ambulation to prevent DVT.  Patient benefited maximally from hospital stay and there were no complications.    Recent vital signs: BP 107/70 (BP Location: Right Arm)   Pulse 61   Temp 98.7 F (37.1 C) (Oral)   Resp 18   Ht 5' 3.75" (1.619 Reyes)   Wt 75.3 kg (166 lb)   SpO2 94%   BMI 28.72 kg/Reyes    Discharge Medications:   Allergies as of 04/05/2017      Reactions   Succinylcholine    "nearly killed me"    Bextra [valdecoxib] Nausea And Vomiting   Wellbutrin [bupropion] Hives   Adhesive [tape] Rash   Paper tape is ok      Medication List    TAKE these medications   cetirizine 10 MG tablet Commonly known as:  ZYRTEC Take 10 mg by mouth daily.   clonazePAM 2 MG tablet Commonly known as:  KLONOPIN Take 2 mg by mouth 2 (two) times daily.   escitalopram 20 MG tablet Commonly known as:  LEXAPRO Take 20 mg by mouth every evening.   fluticasone 50 MCG/ACT nasal spray Commonly known as:  FLONASE Place 1 spray into both nostrils daily.   levothyroxine 88 MCG tablet Commonly known as:  SYNTHROID, LEVOTHROID Take 88  mcg by mouth daily before breakfast.   liothyronine 5 MCG tablet Commonly known as:  CYTOMEL Take 5 mcg by mouth 2 (two) times daily.   methocarbamol 750 MG tablet Commonly known as:  ROBAXIN Take 750 mg by mouth 2 (two) times daily as needed for muscle spasms.   multivitamin with minerals Tabs tablet Take 1 tablet by mouth daily.   omeprazole 20 MG capsule Commonly known as:  PRILOSEC Take 20 mg by mouth daily.       Diagnostic Studies: Dg Cervical Spine 2-3 Views  Result Date: 04/04/2017 CLINICAL DATA:  C5-6 disc arthroplasty. EXAM: DG C-ARM 61-120 MIN; CERVICAL SPINE - 2-3 VIEW COMPARISON:  None. FINDINGS: Exam demonstrates evidence of postsurgical hardware along the inferior  endplate of C5 and superior endplate of C6. Remainder of the exam is unremarkable. Recommend correlation with findings at the time of the procedure. IMPRESSION: Hardware intact at the level of the C5-6 disc space. Electronically Signed   By: Elberta Fortis Reyes.D.   On: 04/04/2017 15:46   Dg C-arm 61-120 Min  Result Date: 04/04/2017 CLINICAL DATA:  C5-6 disc arthroplasty. EXAM: DG C-ARM 61-120 MIN; CERVICAL SPINE - 2-3 VIEW COMPARISON:  None. FINDINGS: Exam demonstrates evidence of postsurgical hardware along the inferior endplate of C5 and superior endplate of C6. Remainder of the exam is unremarkable. Recommend correlation with findings at the time of the procedure. IMPRESSION: Hardware intact at the level of the C5-6 disc space. Electronically Signed   By: Elberta Fortis Reyes.D.   On: 04/04/2017 15:46    Disposition: 01-Home or Self Care  Discharge Instructions    Discharge patient    Complete by:  As directed    Discharge disposition:  01-Home or Self Care   Discharge patient date:  04/05/2017      POD #1 s/p C5-6 Decompression and total disc arthroplasty  - encourage ambulation - Percocet for pain, Valium for muscle spasms -Written scripts for pain signed and in chart -D/C instructions sheet printed and in chart -D/C today  -F/U in office 2 weeks   Signed: Georga Bora 04/10/2017, 10:22 AM

## 2018-04-21 IMAGING — RF DG CERVICAL SPINE 2 OR 3 VIEWS
1 series · 2 of 2 positions shown · non-contrast
Comparison: None.

CLINICAL DATA: C5-6 disc arthroplasty.

EXAM:
DG C-ARM 61-120 MIN; CERVICAL SPINE - 2-3 VIEW

[Series 1: run · 2 of 2 slices shown]
[im 1/2]
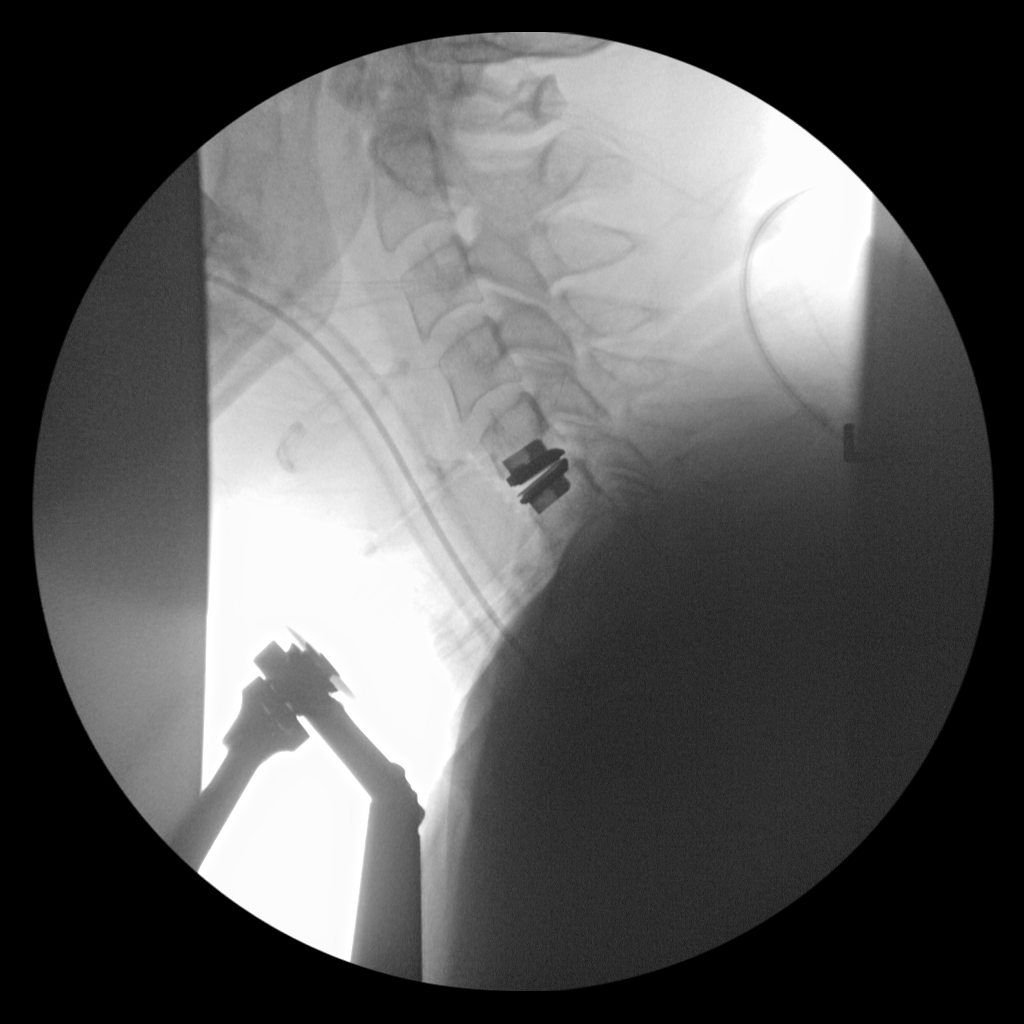
[im 2/2]
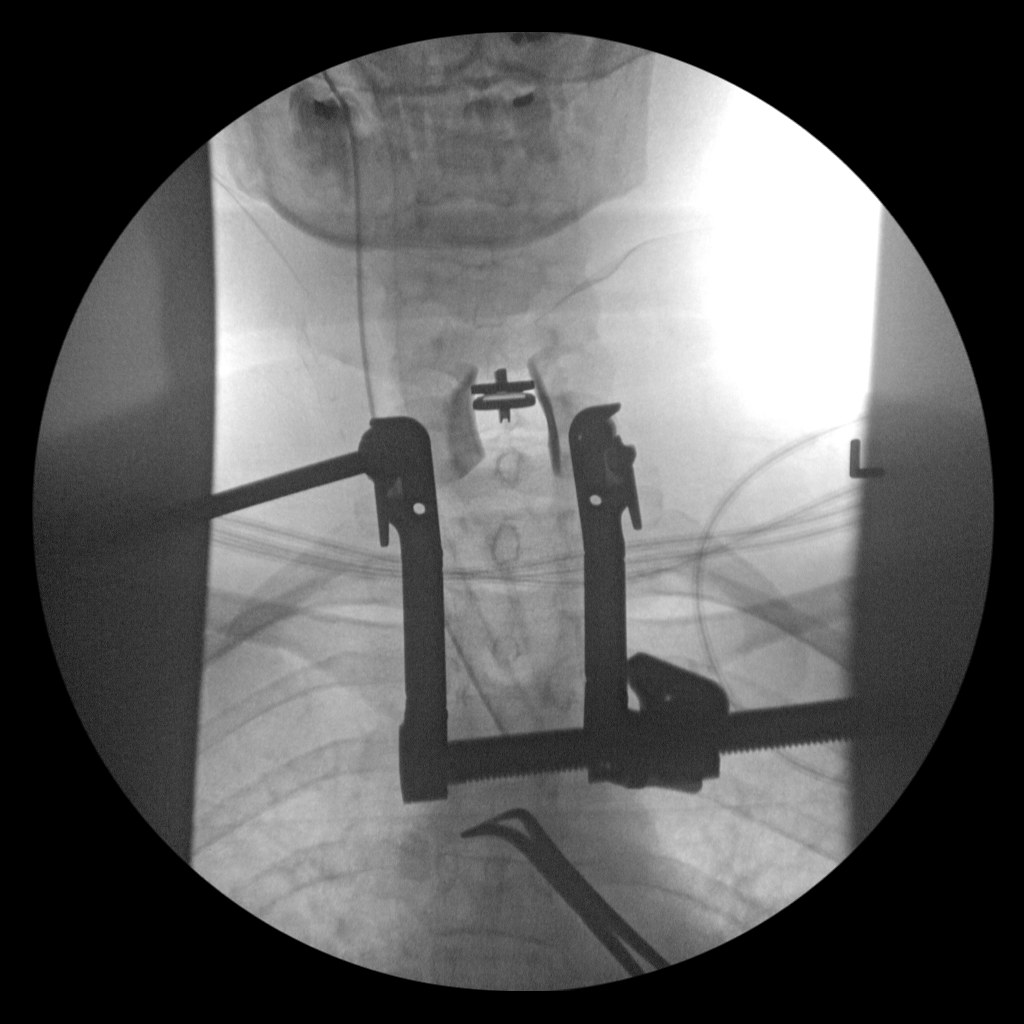

[2 of 2 positions shown; findings below may reference images not displayed]

FINDINGS: Exam demonstrates evidence of postsurgical hardware along the
inferior endplate of C5 and superior endplate of C6. Remainder of
the exam is unremarkable. Recommend correlation with findings at the
time of the procedure.
IMPRESSION: Hardware intact at the level of the C5-6 disc space.
